# Patient Record
Sex: Female | Born: 1960 | Race: White | Hispanic: No | Marital: Married
Health system: Southern US, Community
[De-identification: ages and names within clinical notes are randomized; demographics above are authoritative.]

## PROBLEM LIST (undated history)

## (undated) HISTORY — PX: BREAST BIOPSY: SHX20

---

## 1990-10-11 HISTORY — PX: AUGMENTATION MAMMAPLASTY: SUR837

## 2002-09-13 ENCOUNTER — Encounter: Admission: RE | Admit: 2002-09-13 | Discharge: 2002-09-13 | Payer: Self-pay | Admitting: Obstetrics and Gynecology

## 2002-09-13 ENCOUNTER — Encounter: Payer: Self-pay | Admitting: Obstetrics and Gynecology

## 2005-04-16 ENCOUNTER — Encounter: Admission: RE | Admit: 2005-04-16 | Discharge: 2005-04-16 | Payer: Self-pay | Admitting: Obstetrics and Gynecology

## 2006-05-26 ENCOUNTER — Encounter: Admission: RE | Admit: 2006-05-26 | Discharge: 2006-05-26 | Payer: Self-pay | Admitting: Obstetrics and Gynecology

## 2007-10-10 ENCOUNTER — Encounter: Admission: RE | Admit: 2007-10-10 | Discharge: 2007-10-10 | Payer: Self-pay | Admitting: Obstetrics and Gynecology

## 2008-11-12 ENCOUNTER — Encounter: Admission: RE | Admit: 2008-11-12 | Discharge: 2008-11-12 | Payer: Self-pay | Admitting: Obstetrics and Gynecology

## 2009-11-25 ENCOUNTER — Encounter: Admission: RE | Admit: 2009-11-25 | Discharge: 2009-11-25 | Payer: Self-pay | Admitting: Obstetrics and Gynecology

## 2010-12-28 ENCOUNTER — Other Ambulatory Visit: Payer: Self-pay | Admitting: Obstetrics and Gynecology

## 2010-12-28 DIAGNOSIS — Z1231 Encounter for screening mammogram for malignant neoplasm of breast: Secondary | ICD-10-CM

## 2011-01-06 ENCOUNTER — Ambulatory Visit
Admission: RE | Admit: 2011-01-06 | Discharge: 2011-01-06 | Disposition: A | Discharge status: Home / Self Care | Payer: BC Managed Care – PPO | Source: Ambulatory Visit | Attending: Obstetrics and Gynecology | Admitting: Obstetrics and Gynecology

## 2011-01-06 DIAGNOSIS — Z1231 Encounter for screening mammogram for malignant neoplasm of breast: Secondary | ICD-10-CM

## 2012-01-18 ENCOUNTER — Other Ambulatory Visit: Payer: Self-pay | Admitting: Obstetrics and Gynecology

## 2012-01-18 DIAGNOSIS — Z1231 Encounter for screening mammogram for malignant neoplasm of breast: Secondary | ICD-10-CM

## 2012-01-26 ENCOUNTER — Ambulatory Visit
Admission: RE | Admit: 2012-01-26 | Discharge: 2012-01-26 | Disposition: A | Discharge status: Home / Self Care | Payer: BC Managed Care – PPO | Source: Ambulatory Visit | Attending: Obstetrics and Gynecology | Admitting: Obstetrics and Gynecology

## 2012-01-26 DIAGNOSIS — Z1231 Encounter for screening mammogram for malignant neoplasm of breast: Secondary | ICD-10-CM

## 2013-07-20 ENCOUNTER — Other Ambulatory Visit: Payer: Self-pay

## 2013-07-20 DIAGNOSIS — Z1231 Encounter for screening mammogram for malignant neoplasm of breast: Secondary | ICD-10-CM

## 2013-08-03 ENCOUNTER — Other Ambulatory Visit: Payer: Self-pay

## 2013-08-03 DIAGNOSIS — Z9882 Breast implant status: Secondary | ICD-10-CM

## 2013-08-03 DIAGNOSIS — Z1231 Encounter for screening mammogram for malignant neoplasm of breast: Secondary | ICD-10-CM

## 2013-08-15 ENCOUNTER — Ambulatory Visit
Admission: RE | Admit: 2013-08-15 | Discharge: 2013-08-15 | Disposition: A | Discharge status: Home / Self Care | Payer: Self-pay | Source: Ambulatory Visit

## 2013-08-15 DIAGNOSIS — Z9882 Breast implant status: Secondary | ICD-10-CM

## 2013-08-15 DIAGNOSIS — Z1231 Encounter for screening mammogram for malignant neoplasm of breast: Secondary | ICD-10-CM

## 2013-08-20 ENCOUNTER — Other Ambulatory Visit: Payer: Self-pay | Admitting: Obstetrics and Gynecology

## 2013-08-20 DIAGNOSIS — R928 Other abnormal and inconclusive findings on diagnostic imaging of breast: Secondary | ICD-10-CM

## 2013-08-30 ENCOUNTER — Ambulatory Visit
Admission: RE | Admit: 2013-08-30 | Discharge: 2013-08-30 | Disposition: A | Discharge status: Home / Self Care | Payer: Self-pay | Source: Ambulatory Visit | Attending: Obstetrics and Gynecology | Admitting: Obstetrics and Gynecology

## 2013-08-30 DIAGNOSIS — R928 Other abnormal and inconclusive findings on diagnostic imaging of breast: Secondary | ICD-10-CM

## 2014-02-27 ENCOUNTER — Other Ambulatory Visit: Payer: Self-pay | Admitting: Obstetrics and Gynecology

## 2014-02-27 DIAGNOSIS — R921 Mammographic calcification found on diagnostic imaging of breast: Secondary | ICD-10-CM

## 2014-03-07 ENCOUNTER — Ambulatory Visit
Admission: RE | Admit: 2014-03-07 | Discharge: 2014-03-07 | Disposition: A | Discharge status: Home / Self Care | Payer: BC Managed Care – PPO | Source: Ambulatory Visit | Attending: Obstetrics and Gynecology | Admitting: Obstetrics and Gynecology

## 2014-03-07 DIAGNOSIS — R921 Mammographic calcification found on diagnostic imaging of breast: Secondary | ICD-10-CM

## 2014-09-10 ENCOUNTER — Other Ambulatory Visit: Payer: Self-pay | Admitting: Obstetrics and Gynecology

## 2014-09-10 DIAGNOSIS — R921 Mammographic calcification found on diagnostic imaging of breast: Secondary | ICD-10-CM

## 2014-09-20 ENCOUNTER — Other Ambulatory Visit: Payer: Self-pay | Admitting: Obstetrics and Gynecology

## 2014-09-20 ENCOUNTER — Ambulatory Visit
Admission: RE | Admit: 2014-09-20 | Discharge: 2014-09-20 | Disposition: A | Discharge status: Home / Self Care | Payer: BLUE CROSS/BLUE SHIELD | Source: Ambulatory Visit | Attending: Obstetrics and Gynecology | Admitting: Obstetrics and Gynecology

## 2014-09-20 DIAGNOSIS — R921 Mammographic calcification found on diagnostic imaging of breast: Secondary | ICD-10-CM

## 2015-08-20 ENCOUNTER — Other Ambulatory Visit: Payer: Self-pay | Admitting: Obstetrics and Gynecology

## 2015-08-20 DIAGNOSIS — R921 Mammographic calcification found on diagnostic imaging of breast: Secondary | ICD-10-CM

## 2015-09-24 ENCOUNTER — Ambulatory Visit
Admission: RE | Admit: 2015-09-24 | Discharge: 2015-09-24 | Disposition: A | Discharge status: Home / Self Care | Payer: BLUE CROSS/BLUE SHIELD | Source: Ambulatory Visit | Attending: Obstetrics and Gynecology | Admitting: Obstetrics and Gynecology

## 2015-09-24 DIAGNOSIS — R921 Mammographic calcification found on diagnostic imaging of breast: Secondary | ICD-10-CM

## 2016-11-03 ENCOUNTER — Other Ambulatory Visit: Payer: Self-pay | Admitting: Obstetrics and Gynecology

## 2016-11-03 DIAGNOSIS — Z1231 Encounter for screening mammogram for malignant neoplasm of breast: Secondary | ICD-10-CM

## 2016-11-29 ENCOUNTER — Ambulatory Visit
Admission: RE | Admit: 2016-11-29 | Discharge: 2016-11-29 | Disposition: A | Discharge status: Home / Self Care | Payer: BLUE CROSS/BLUE SHIELD | Source: Ambulatory Visit | Attending: Obstetrics and Gynecology | Admitting: Obstetrics and Gynecology

## 2016-11-29 DIAGNOSIS — Z1231 Encounter for screening mammogram for malignant neoplasm of breast: Secondary | ICD-10-CM

## 2016-12-01 ENCOUNTER — Other Ambulatory Visit: Payer: Self-pay | Admitting: Obstetrics and Gynecology

## 2016-12-01 DIAGNOSIS — R928 Other abnormal and inconclusive findings on diagnostic imaging of breast: Secondary | ICD-10-CM

## 2016-12-03 ENCOUNTER — Other Ambulatory Visit: Payer: Self-pay | Admitting: Obstetrics and Gynecology

## 2016-12-03 ENCOUNTER — Ambulatory Visit
Admission: RE | Admit: 2016-12-03 | Discharge: 2016-12-03 | Disposition: A | Discharge status: Home / Self Care | Payer: BLUE CROSS/BLUE SHIELD | Source: Ambulatory Visit | Attending: Obstetrics and Gynecology | Admitting: Obstetrics and Gynecology

## 2016-12-03 DIAGNOSIS — N6320 Unspecified lump in the left breast, unspecified quadrant: Secondary | ICD-10-CM

## 2016-12-03 DIAGNOSIS — R928 Other abnormal and inconclusive findings on diagnostic imaging of breast: Secondary | ICD-10-CM

## 2016-12-08 ENCOUNTER — Other Ambulatory Visit: Payer: Self-pay | Admitting: Obstetrics and Gynecology

## 2016-12-08 DIAGNOSIS — N6320 Unspecified lump in the left breast, unspecified quadrant: Secondary | ICD-10-CM

## 2016-12-09 ENCOUNTER — Ambulatory Visit
Admission: RE | Admit: 2016-12-09 | Discharge: 2016-12-09 | Disposition: A | Discharge status: Home / Self Care | Payer: BLUE CROSS/BLUE SHIELD | Source: Ambulatory Visit | Attending: Obstetrics and Gynecology | Admitting: Obstetrics and Gynecology

## 2016-12-09 DIAGNOSIS — N6320 Unspecified lump in the left breast, unspecified quadrant: Secondary | ICD-10-CM

## 2017-11-18 ENCOUNTER — Other Ambulatory Visit: Payer: Self-pay | Admitting: Obstetrics and Gynecology

## 2017-11-18 DIAGNOSIS — Z1231 Encounter for screening mammogram for malignant neoplasm of breast: Secondary | ICD-10-CM

## 2017-12-12 ENCOUNTER — Ambulatory Visit: Payer: BLUE CROSS/BLUE SHIELD

## 2017-12-30 ENCOUNTER — Ambulatory Visit
Admission: RE | Admit: 2017-12-30 | Discharge: 2017-12-30 | Disposition: A | Discharge status: Home / Self Care | Payer: BLUE CROSS/BLUE SHIELD | Source: Ambulatory Visit | Attending: Obstetrics and Gynecology | Admitting: Obstetrics and Gynecology

## 2017-12-30 DIAGNOSIS — Z1231 Encounter for screening mammogram for malignant neoplasm of breast: Secondary | ICD-10-CM

## 2018-04-03 ENCOUNTER — Ambulatory Visit: Payer: BLUE CROSS/BLUE SHIELD | Admitting: Sports Medicine

## 2018-04-03 ENCOUNTER — Ambulatory Visit
Admission: RE | Admit: 2018-04-03 | Discharge: 2018-04-03 | Disposition: A | Discharge status: Home / Self Care | Payer: BLUE CROSS/BLUE SHIELD | Source: Ambulatory Visit | Attending: Sports Medicine | Admitting: Sports Medicine

## 2018-04-03 VITALS — BP 100/70 | Ht 66.5 in | Wt 160.0 lb

## 2018-04-03 DIAGNOSIS — M1712 Unilateral primary osteoarthritis, left knee: Secondary | ICD-10-CM | POA: Diagnosis not present

## 2018-04-03 DIAGNOSIS — M25562 Pain in left knee: Secondary | ICD-10-CM

## 2018-04-04 ENCOUNTER — Encounter: Payer: Self-pay | Admitting: Sports Medicine

## 2018-04-04 NOTE — Progress Notes (Signed)
   Subjective:    Patient ID: Jordan LikesLori E Babington, female    DOB: [birth year], 57 y.o.   MRN: [id number]  HPI chief complaint: Left knee pain  Very pleasant 57 year old female comes in today complaining of 2 weeks of left knee pain.  No recent injury that she can recall but she describes a gradual onset of pain that she localizes to the medial knee.  It is present primarily with running and is associated with some catching and intermittent swelling.  She feels like the knee wants to give way.  She stopped running for a period of time and as a result, her pain has improved dramatically.  She has been able to do other forms of exercise without pain.  She has noticed that icing helps.  She also has a double upright brace that she has been wearing on her left knee which she got as the result of a previous right knee MCL sprain 4 to 5 years ago.  She denies any true locking of the knee. No prior knee surgeries.  Past medical history reviewed Medications reviewed Allergies reviewed    Review of Systems    As above Objective:   Physical Exam  Well-developed, well-nourished.  No acute distress.  Awake alert and oriented x3.  Vital signs reviewed.  Left knee: Full range of motion.  No obvious effusion.  Trace patellofemoral crepitus.  She is tender to palpation along the medial joint line.  No tenderness along the lateral joint line.  Negative McMurray's, negative Thessaly's.  Knee is stable to valgus and varus stressing.  Negative anterior drawer, negative posterior drawer.  No popliteal cyst.  Neurovascularly intact distally.  Walking without a significant limp.  MSK ultrasound of the left knee was performed.  Limited images were obtained.  There is spurring along the medial compartment with some mild bulging of the medial meniscus but no obvious tear is seen.  No effusion.  X-rays of the left knee including AP, lateral, and sunrise views show some mild patellofemoral and medial compartmental DJD.   Nothing acute.      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  If her symptoms resolve then she may resume running but if they return she may want to just stick with other forms of exercise.  Alternatively, we could consider cortisone injection or further diagnostic imaging to rule out a degenerative meniscal tear.  She may discontinue her double upright brace in favor of a simple compression sleeve.  We will also give her home exercises consisting of VMO and hip abductor strengthening.  Follow-up for ongoing or recalcitrant issues.

## 2019-01-04 ENCOUNTER — Other Ambulatory Visit: Payer: Self-pay

## 2019-01-04 ENCOUNTER — Other Ambulatory Visit: Payer: Self-pay | Admitting: Obstetrics and Gynecology

## 2019-01-04 DIAGNOSIS — Z1231 Encounter for screening mammogram for malignant neoplasm of breast: Secondary | ICD-10-CM

## 2019-01-10 ENCOUNTER — Other Ambulatory Visit: Payer: Self-pay | Admitting: Family Medicine

## 2019-01-10 DIAGNOSIS — N649 Disorder of breast, unspecified: Secondary | ICD-10-CM

## 2019-01-30 ENCOUNTER — Other Ambulatory Visit: Payer: Self-pay

## 2019-01-30 ENCOUNTER — Ambulatory Visit
Admission: RE | Admit: 2019-01-30 | Discharge: 2019-01-30 | Disposition: A | Discharge status: Home / Self Care | Payer: BC Managed Care – PPO | Source: Ambulatory Visit | Attending: Family Medicine | Admitting: Family Medicine

## 2019-01-30 ENCOUNTER — Ambulatory Visit
Admission: RE | Admit: 2019-01-30 | Discharge: 2019-01-30 | Disposition: A | Discharge status: Home / Self Care | Payer: BLUE CROSS/BLUE SHIELD | Source: Ambulatory Visit | Attending: Family Medicine | Admitting: Family Medicine

## 2019-01-30 ENCOUNTER — Other Ambulatory Visit: Payer: Self-pay | Admitting: Family Medicine

## 2019-01-30 DIAGNOSIS — N649 Disorder of breast, unspecified: Secondary | ICD-10-CM

## 2020-01-29 ENCOUNTER — Other Ambulatory Visit: Payer: Self-pay | Admitting: Family Medicine

## 2020-01-29 DIAGNOSIS — Z1231 Encounter for screening mammogram for malignant neoplasm of breast: Secondary | ICD-10-CM

## 2020-02-07 ENCOUNTER — Ambulatory Visit
Admission: RE | Admit: 2020-02-07 | Discharge: 2020-02-07 | Disposition: A | Discharge status: Home / Self Care | Payer: BC Managed Care – PPO | Source: Ambulatory Visit | Attending: Family Medicine | Admitting: Family Medicine

## 2020-02-07 ENCOUNTER — Other Ambulatory Visit: Payer: Self-pay

## 2020-02-07 DIAGNOSIS — Z1231 Encounter for screening mammogram for malignant neoplasm of breast: Secondary | ICD-10-CM

## 2020-02-13 ENCOUNTER — Other Ambulatory Visit: Payer: Self-pay | Admitting: Family Medicine

## 2020-02-13 DIAGNOSIS — R928 Other abnormal and inconclusive findings on diagnostic imaging of breast: Secondary | ICD-10-CM

## 2020-02-15 ENCOUNTER — Ambulatory Visit: Payer: BC Managed Care – PPO

## 2020-02-15 ENCOUNTER — Ambulatory Visit
Admission: RE | Admit: 2020-02-15 | Discharge: 2020-02-15 | Disposition: A | Discharge status: Home / Self Care | Payer: BC Managed Care – PPO | Source: Ambulatory Visit | Attending: Family Medicine | Admitting: Family Medicine

## 2020-02-15 ENCOUNTER — Other Ambulatory Visit: Payer: Self-pay | Admitting: Family Medicine

## 2020-02-15 ENCOUNTER — Other Ambulatory Visit: Payer: Self-pay

## 2020-02-15 DIAGNOSIS — R928 Other abnormal and inconclusive findings on diagnostic imaging of breast: Secondary | ICD-10-CM

## 2021-03-26 ENCOUNTER — Other Ambulatory Visit: Payer: Self-pay | Admitting: Family Medicine

## 2021-03-26 DIAGNOSIS — Z1231 Encounter for screening mammogram for malignant neoplasm of breast: Secondary | ICD-10-CM

## 2021-04-02 ENCOUNTER — Ambulatory Visit
Admission: RE | Admit: 2021-04-02 | Discharge: 2021-04-02 | Disposition: A | Discharge status: Home / Self Care | Payer: BC Managed Care – PPO | Source: Ambulatory Visit | Attending: Family Medicine | Admitting: Family Medicine

## 2021-04-02 ENCOUNTER — Other Ambulatory Visit: Payer: Self-pay

## 2021-04-02 DIAGNOSIS — Z1231 Encounter for screening mammogram for malignant neoplasm of breast: Secondary | ICD-10-CM

## 2022-06-07 ENCOUNTER — Other Ambulatory Visit: Payer: Self-pay | Admitting: Family Medicine

## 2022-06-07 DIAGNOSIS — Z1231 Encounter for screening mammogram for malignant neoplasm of breast: Secondary | ICD-10-CM

## 2022-07-30 ENCOUNTER — Ambulatory Visit
Admission: RE | Admit: 2022-07-30 | Discharge: 2022-07-30 | Disposition: A | Discharge status: Home / Self Care | Payer: BC Managed Care – PPO | Source: Ambulatory Visit | Attending: Family Medicine | Admitting: Family Medicine

## 2022-07-30 DIAGNOSIS — Z1231 Encounter for screening mammogram for malignant neoplasm of breast: Secondary | ICD-10-CM

## 2023-06-26 IMAGING — MG DIGITAL SCREENING BREAST BILAT IMPLANT W/ TOMO W/ CAD
9 of 12 series · 9 of 28 positions shown · non-contrast
Comparison: Previous exam(s).

CLINICAL DATA: Screening.

EXAM:
DIGITAL SCREENING BILATERAL MAMMOGRAM WITH IMPLANTS, CAD AND
TOMOSYNTHESIS
TECHNIQUE: Bilateral screening digital craniocaudal and mediolateral oblique
mammograms were obtained. Bilateral screening digital breast
tomosynthesis was performed. The images were evaluated with
computer-aided detection. Standard and/or implant displaced views
were performed.

[R CC]
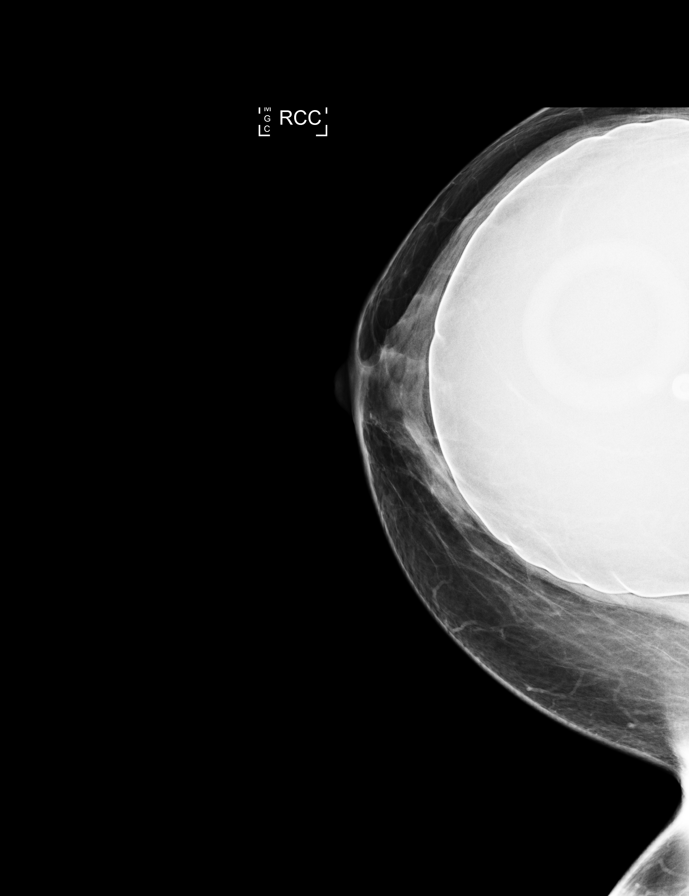

[L MLO]
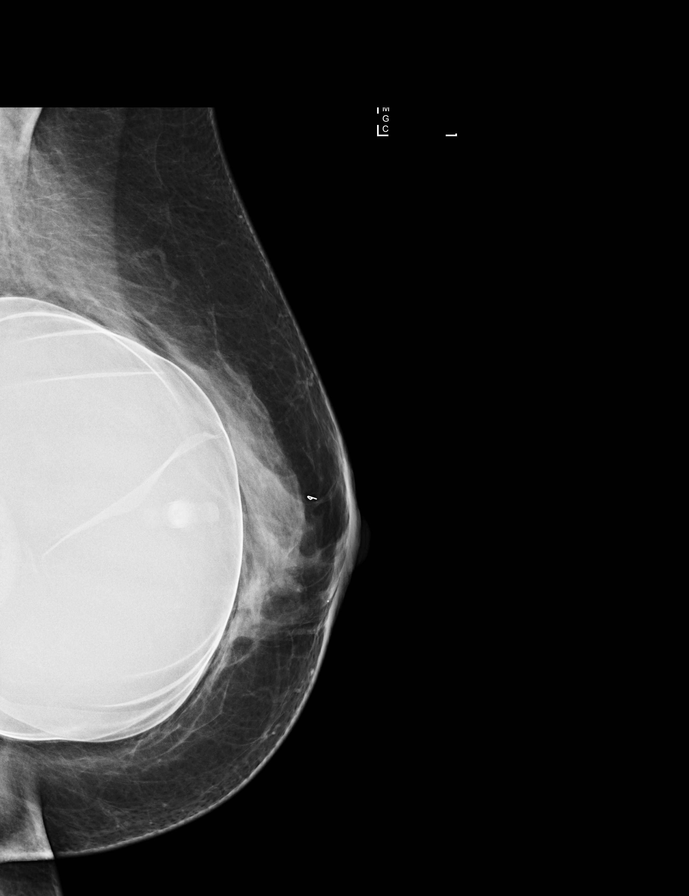

[L CC]
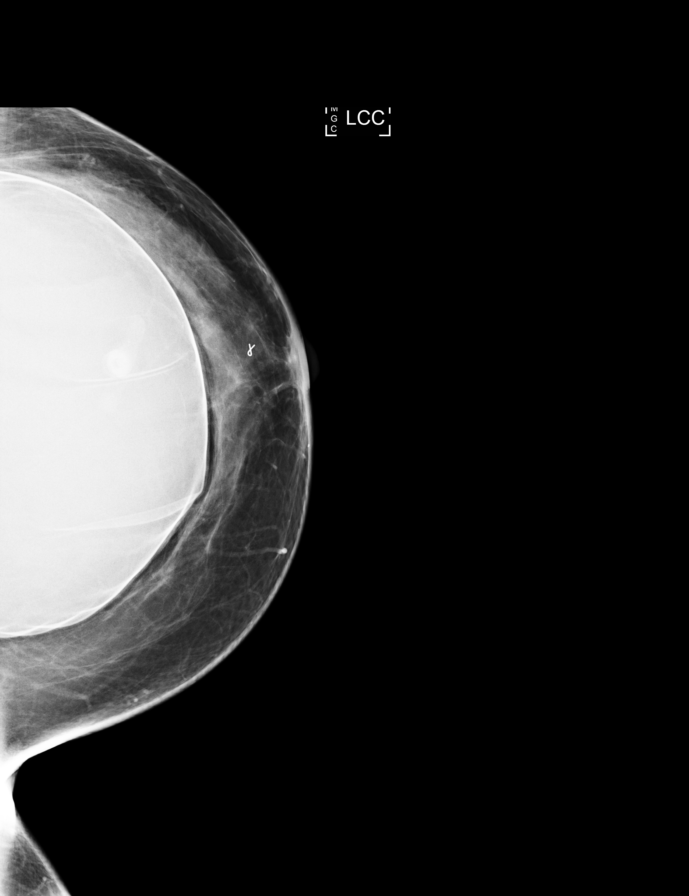

[R MLO]
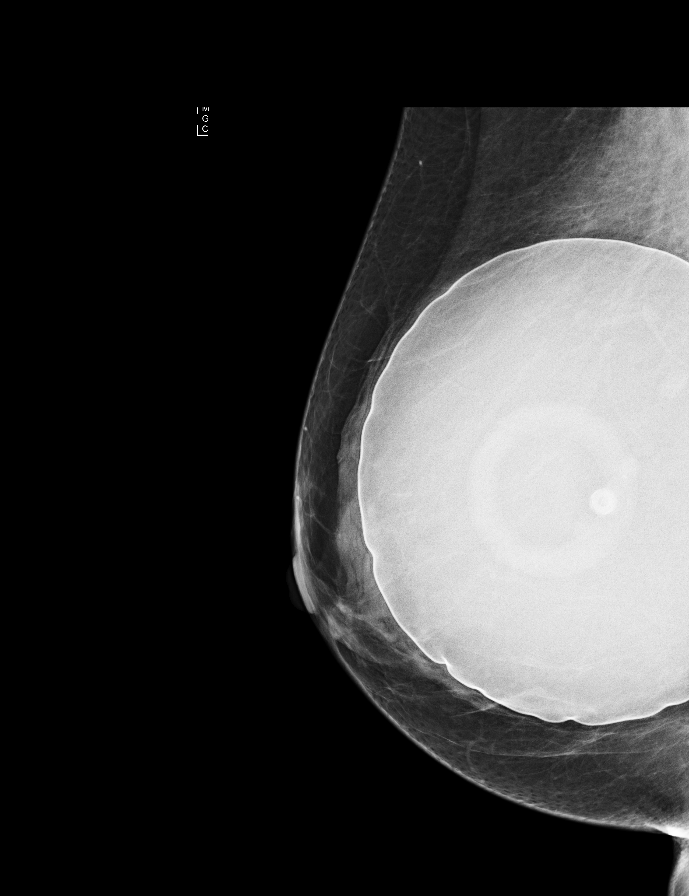

[L MLO synth-2D]
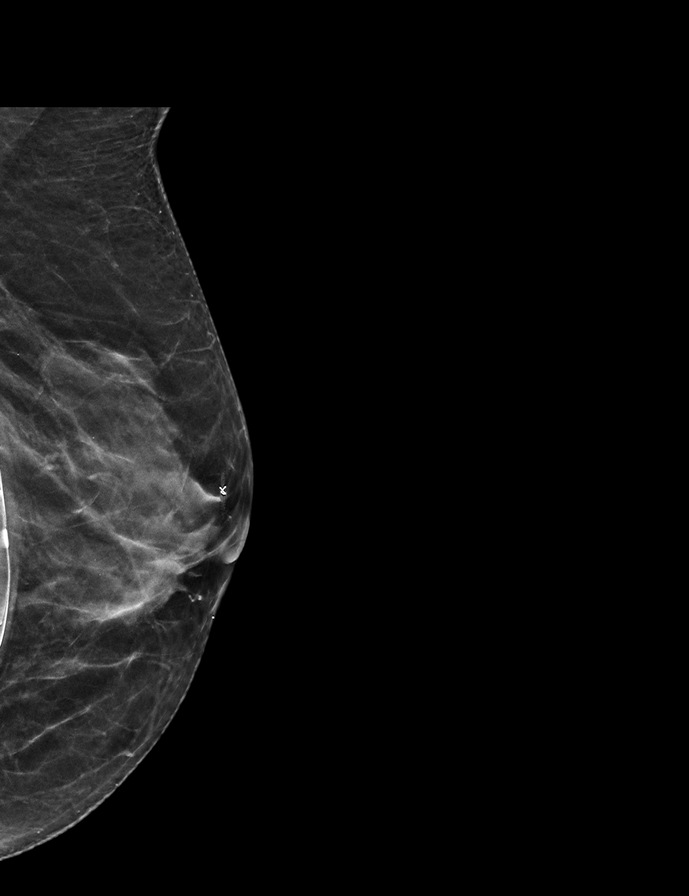

[L CC synth-2D]
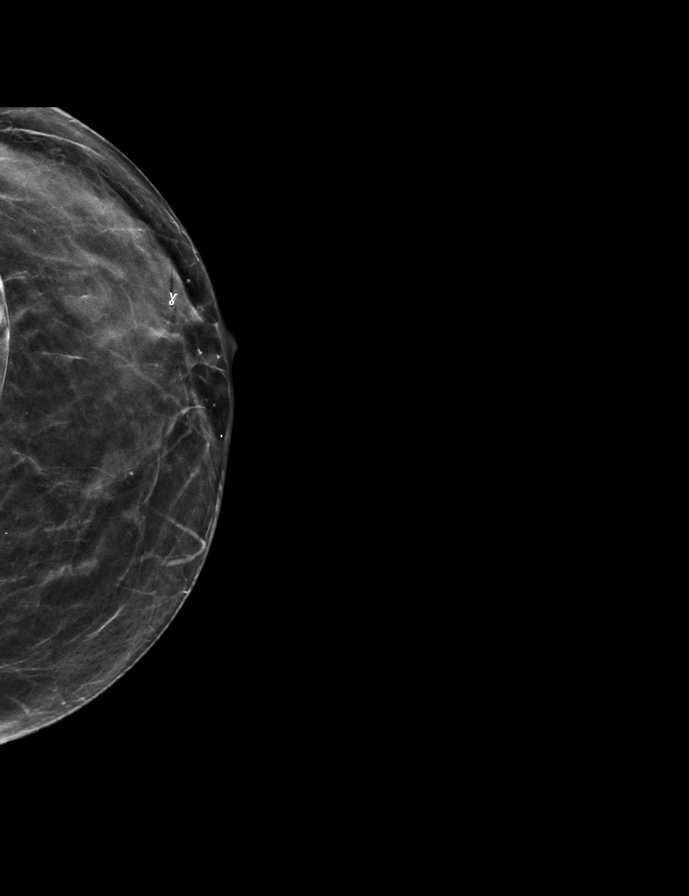

[R MLO synth-2D]
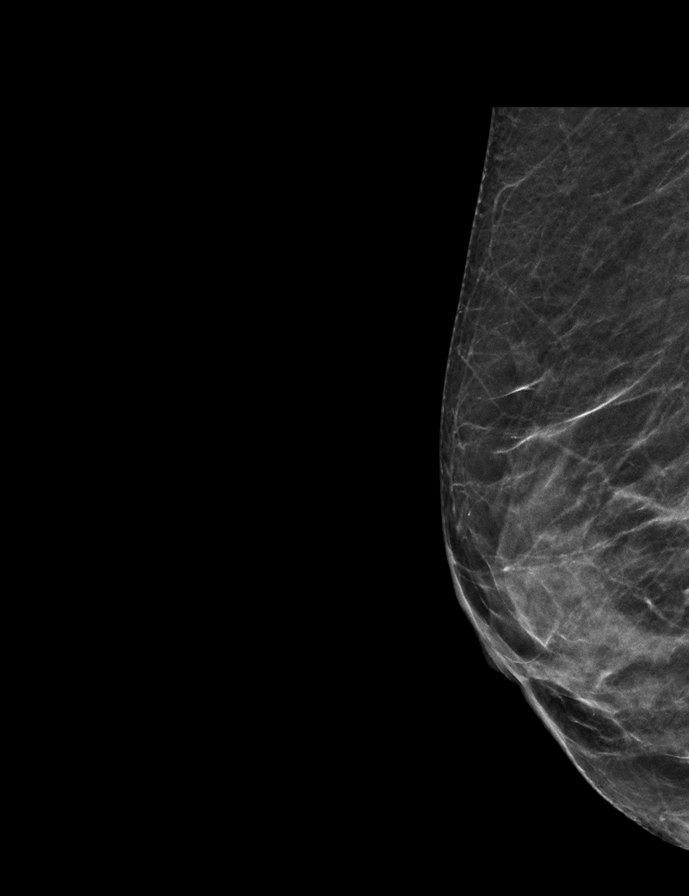

[R CC synth-2D]
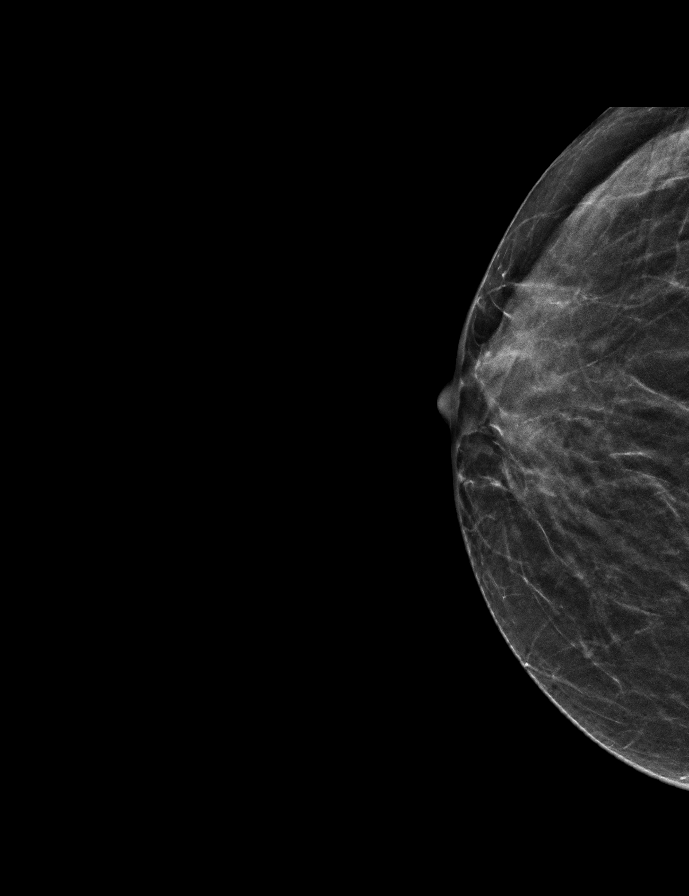

[L CCID BREAST TOMOSYNTHESIS IMAGE tomo · tomo slice 23/46.0]
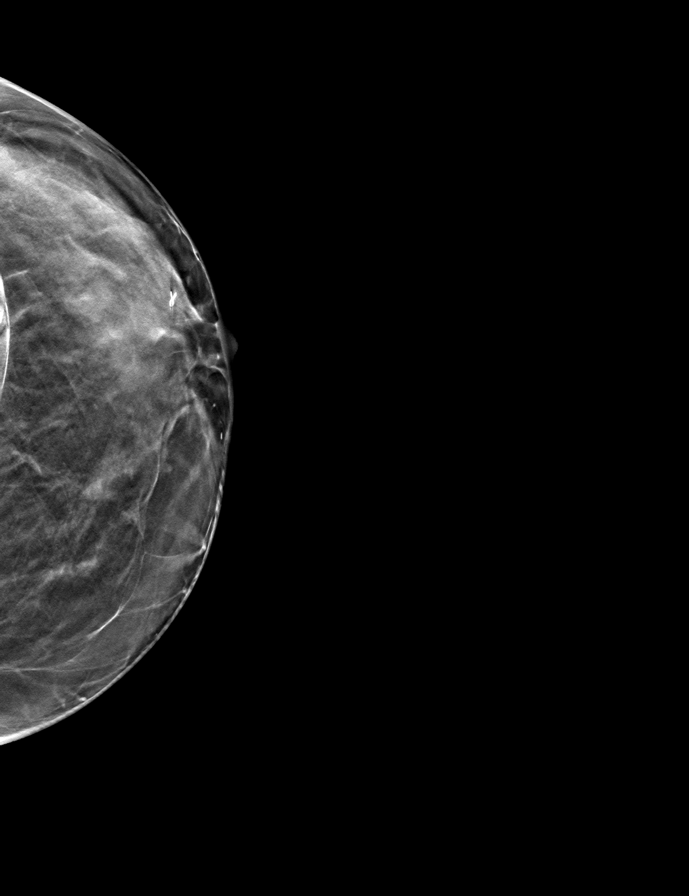

[9 of 28 positions shown; findings below may reference images not displayed]

ACR Breast Density Category c: The breast tissue is heterogeneously
dense, which may obscure small masses.
FINDINGS: The patient has retropectoral implants. There are no findings
suspicious for malignancy.
IMPRESSION: No mammographic evidence of malignancy. A result letter of this
screening mammogram will be mailed directly to the patient.

RECOMMENDATION:
Screening mammogram in one year. (Code:LT-E-7TH)

BI-RADS CATEGORY  1:  Negative.
